# Patient Record
Sex: Male | Born: 1965 | Race: Black or African American | Hispanic: No | Marital: Single | Smoking: Current every day smoker
Health system: Southern US, Community
[De-identification: ages and names within clinical notes are randomized; demographics above are authoritative.]

## PROBLEM LIST (undated history)

## (undated) DIAGNOSIS — F1410 Cocaine abuse, uncomplicated: Secondary | ICD-10-CM

## (undated) DIAGNOSIS — F1010 Alcohol abuse, uncomplicated: Secondary | ICD-10-CM

## (undated) DIAGNOSIS — M549 Dorsalgia, unspecified: Secondary | ICD-10-CM

---

## 2000-12-09 ENCOUNTER — Emergency Department (HOSPITAL_COMMUNITY): Admission: EM | Admit: 2000-12-09 | Discharge: 2000-12-09 | Payer: Self-pay | Admitting: Emergency Medicine

## 2013-01-29 ENCOUNTER — Emergency Department: Payer: Self-pay | Admitting: Emergency Medicine

## 2013-01-29 LAB — URINALYSIS, COMPLETE
Bilirubin,UR: NEGATIVE
Glucose,UR: NEGATIVE mg/dL (ref 0–75)
Leukocyte Esterase: NEGATIVE
Ph: 5 (ref 4.5–8.0)
Protein: NEGATIVE
RBC,UR: 3 /HPF (ref 0–5)
Specific Gravity: 1.031 (ref 1.003–1.030)
Squamous Epithelial: NONE SEEN
WBC UR: 3 /HPF (ref 0–5)

## 2013-06-06 ENCOUNTER — Emergency Department (HOSPITAL_COMMUNITY)
Admission: EM | Admit: 2013-06-06 | Discharge: 2013-06-06 | Disposition: A | Discharge status: Home / Self Care | Payer: Self-pay | Attending: Emergency Medicine | Admitting: Emergency Medicine

## 2013-06-06 ENCOUNTER — Encounter (HOSPITAL_COMMUNITY): Payer: Self-pay | Admitting: Emergency Medicine

## 2013-06-06 DIAGNOSIS — F1410 Cocaine abuse, uncomplicated: Secondary | ICD-10-CM | POA: Insufficient documentation

## 2013-06-06 DIAGNOSIS — F17200 Nicotine dependence, unspecified, uncomplicated: Secondary | ICD-10-CM | POA: Insufficient documentation

## 2013-06-06 DIAGNOSIS — F1010 Alcohol abuse, uncomplicated: Secondary | ICD-10-CM | POA: Insufficient documentation

## 2013-06-06 HISTORY — DX: Cocaine abuse, uncomplicated: F14.10

## 2013-06-06 HISTORY — DX: Alcohol abuse, uncomplicated: F10.10

## 2013-06-06 LAB — COMPREHENSIVE METABOLIC PANEL
ALT: 29 U/L (ref 0–53)
Alkaline Phosphatase: 52 U/L (ref 39–117)
CO2: 26 mEq/L (ref 19–32)
GFR calc Af Amer: >90 mL/min (ref >90)
GFR calc non Af Amer: >90 mL/min (ref >90)
Glucose, Bld: 106 mg/dL — ABNORMAL HIGH (ref 70–99)
Potassium: 3.4 mEq/L — ABNORMAL LOW (ref 3.5–5.1)
Sodium: 137 mEq/L (ref 135–145)

## 2013-06-06 LAB — CBC WITH DIFFERENTIAL/PLATELET
Eosinophils Relative: 2 % (ref 0–5)
Hemoglobin: 15 g/dL (ref 13.0–17.0)
Lymphocytes Relative: 23 % (ref 12–46)
Lymphs Abs: 2.4 10*3/uL (ref 0.7–4.0)
MCV: 95.5 fL (ref 78.0–100.0)
Monocytes Relative: 9 % (ref 3–12)
Neutrophils Relative %: 66 % (ref 43–77)
Platelets: 256 10*3/uL (ref 150–400)
RBC: 4.48 MIL/uL (ref 4.22–5.81)
WBC: 10.4 10*3/uL (ref 4.0–10.5)

## 2013-06-06 NOTE — Discharge Instructions (Signed)
Please followup with one of the treatment programs given to you on the resource sheet.  Return to the emergency department for worsening condition or new concerning symptoms.   Alcohol Problems Most adults who drink alcohol drink in moderation (not a lot) are at low risk for developing problems related to their drinking. However, all drinkers, including low-risk drinkers, should know about the health risks connected with drinking alcohol. RECOMMENDATIONS FOR LOW-RISK DRINKING  Drink in moderation. Moderate drinking is defined as follows:   Men - no more than 2 drinks per day.  Nonpregnant women - no more than 1 drink per day.  Over age 5 - no more than 1 drink per day. A standard drink is 12 grams of pure alcohol, which is equal to a 12 ounce bottle of beer or wine cooler, a 5 ounce glass of wine, or 1.5 ounces of distilled spirits (such as whiskey, brandy, vodka, or rum).  ABSTAIN FROM (DO NOT DRINK) ALCOHOL:  When pregnant or considering pregnancy.  When taking a medication that interacts with alcohol.  If you are alcohol dependent.  A medical condition that prohibits drinking alcohol (such as ulcer, liver disease, or heart disease). DISCUSS WITH YOUR CAREGIVER:  If you are at risk for coronary heart disease, discuss the potential benefits and risks of alcohol use: Light to moderate drinking is associated with lower rates of coronary heart disease in certain populations (for example, men over age 67 and postmenopausal women). Infrequent or nondrinkers are advised not to begin light to moderate drinking to reduce the risk of coronary heart disease so as to avoid creating an alcohol-related problem. Similar protective effects can likely be gained through proper diet and exercise.  Women and the elderly have smaller amounts of body water than men. As a result women and the elderly achieve a higher blood alcohol concentration after drinking the same amount of alcohol.  Exposing a fetus to  alcohol can cause a broad range of birth defects referred to as Fetal Alcohol Syndrome (FAS) or Alcohol-Related Birth Defects (ARBD). Although FAS/ARBD is connected with excessive alcohol consumption during pregnancy, studies also have reported neurobehavioral problems in infants born to mothers reporting drinking an average of 1 drink per day during pregnancy.  Heavier drinking (the consumption of more than 4 drinks per occasion by men and more than 3 drinks per occasion by women) impairs learning (cognitive) and psychomotor functions and increases the risk of alcohol-related problems, including accidents and injuries. CAGE QUESTIONS:   Have you ever felt that you should Cut down on your drinking?  Have people Annoyed you by criticizing your drinking?  Have you ever felt bad or Guilty about your drinking?  Have you ever had a drink first thing in the morning to steady your nerves or get rid of a hangover (Eye opener)? If you answered positively to any of these questions: You may be at risk for alcohol-related problems if alcohol consumption is:   Men: Greater than 14 drinks per week or more than 4 drinks per occasion.  Women: Greater than 7 drinks per week or more than 3 drinks per occasion. Do you or your family have a medical history of alcohol-related problems, such as:  Blackouts.  Sexual dysfunction.  Depression.  Trauma.  Liver dysfunction.  Sleep disorders.  Hypertension.  Chronic abdominal pain.  Has your drinking ever caused you problems, such as problems with your family, problems with your work (or school) performance, or accidents/injuries?  Do you have a compulsion to  drink or a preoccupation with drinking?  Do you have poor control or are you unable to stop drinking once you have started?  Do you have to drink to avoid withdrawal symptoms?  Do you have problems with withdrawal such as tremors, nausea, sweats, or mood disturbances?  Does it take more alcohol  than in the past to get you high?  Do you feel a strong urge to drink?  Do you change your plans so that you can have a drink?  Do you ever drink in the morning to relieve the shakes or a hangover? If you have answered a number of the previous questions positively, it may be time for you to talk to your caregivers, family, and friends and see if they think you have a problem. Alcoholism is a chemical dependency that keeps getting worse and will eventually destroy your health and relationships. Many alcoholics end up dead, impoverished, or in prison. This is often the end result of all chemical dependency.  Do not be discouraged if you are not ready to take action immediately.  Decisions to change behavior often involve up and down desires to change and feeling like you cannot decide.  Try to think more seriously about your drinking behavior.  Think of the reasons to quit. WHERE TO GO FOR ADDITIONAL INFORMATION   The National Institute on Alcohol Abuse and Alcoholism (NIAAA) BasicStudents.dk  [employer] on Alcoholism and Drug Dependence (NCADD) www.ncadd.org  American Society of Addiction Medicine (ASAM) RoyalDiary.gl  Document Released: 11/14/2005 Document Revised: 02/06/2012 Document Reviewed: 07/02/2008 [facility] Patient Information 2014 [place], [place].  Cocaine Abuse and Chemical Dependency WHEN IS DRUG USE A PROBLEM? Anytime drug use is interfering with normal living activities it has become abuse. This includes problems with family and friends. Psychological dependence has developed when your mind tells you that the drug is needed. This is usually followed by physical dependence which has developed when continuing increases of drug are required to get the same feeling or "high". This is known as addiction or chemical dependency. A person's risk is much higher if there is a history of chemical dependency in the family. SIGNS OF CHEMICAL DEPENDENCY:  Been told by friends or  family that drugs have become a problem.  Fighting when using drugs.  Having blackouts (not remembering what you do while using).  Feel sick from using drugs but continue using.  Lie about use or amounts of drugs (chemicals) used.  Need chemicals to get you going.  Suffer in work International aid/development worker or school because of drug use.  Get sick from use of drugs but continue to use anyway.  Need drugs to relate to people or feel comfortable in social situations.  Use drugs to forget problems. Yes answered to any of the above signs of chemical dependency indicates there are problems. The longer the use of drugs continues, the greater the problems will become. If there is a family history of drug or alcohol use it is best not to experiment with these drugs. Experimentation leads to tolerance and needing to use more of the drug to get the same feeling. This is followed by addiction where drugs become the most important part of life. It becomes more important to take drugs than participate in the other usual activities of life including relating to friends and family. Addiction is followed by dependency where drugs are now needed not just to get high but to feel normal. Addiction cannot be cured but it can be stopped. This often requires  outside help and the care of professionals. Treatment centers are listed in the yellow pages under: Cocaine, Narcotics, and Alcoholics Anonymous. Most hospitals and clinics can refer you to a specialized care center. WHAT IS COCAINE? Cocaine is a strong nervous system stimulant which speeds up the body and gives the user the feeling that they have increased energy, loss of appetite and feelings of great pleasure. This "high" which begins within several minutes and lasts for less than an hour is followed by a "crash". The crash and depressed feelings that come with it cause a craving for the drug to regain the high. HOW IS COCANINE USED? Cocaine is snorted, injected, and  smoked as free- base or crack. Because smoking the drug produces a greater high it is also associated with a greater low. It is therefore more rapidly addicting. WHAT ARE THE EFFECTS OF COCAINE? It is an anesthetic (pain killer) and a stimulant (it causes a high which gives a false feeling of well being). It increases heart and breathing rates with increases in body temperature and blood pressure. It removes appetite. It causes seizures (convulsions) along with nausea (feeling sick to your stomach), vomiting and stomach pain. This dangerous combination can lead to death. Trying to keep the high feeling leads to greater and greater drug use and this leads to addiction. Addiction can only be helped by stopping use of all chemicals. This is hard but may save your life. If the addiction is continued, the only possible outcome is loss of self respect and self esteem, violence, death, and eventually prison if the addict is fortunate enough to be caught and able to receive help prior to this last life ending event. OTHER HEALTH RISKS OF COCAINE AND ALL DRUG USE ARE:  The increased possibility of getting AIDS or hepatitis (liver inflammation).  Having a baby born which is addicted to cocaine and must go through painful withdrawal including shaking, jerking, and crying in pain. Many of the babies die. Other babies go through life with lifelong disabilities and learning problems. HOW TO STAY DRUG FREE ONCE YOU HAVE QUIT USING:  Develop healthy activities and form friends who do not use drugs.  Stay away from the drug scene.  Tell the pusher or former friend you have other better things to do.  Have ready excuses available about why you cannot use. For more help or information contact your local physician, clinic, hospital or dial 1-800-cocaine 352-510-8706). Document Released: 11/11/2000 Document Revised: 02/06/2012 Document Reviewed: 07/02/2008 [facility] Patient Information 2014 [place], [place].  Drug  Abuse, Frequently Asked Questions Drug addiction is a complex brain disease. It is characterized by compulsive, at times uncontrollable, drug craving, seeking, and use that persists even in the face of extremely negative results. Drug seeking becomes compulsive, in large part as a result of the effects of prolonged drug use on brain functioning and, thus, on behavior. For many people, drug addiction becomes chronic, with relapses possible even after long periods of being off the drug. HOW QUICKLY CAN I BECOME ADDICTED TO A DRUG? There is no easy answer to this. If and how quickly you might become addicted to a drug depends on many factors including the biology of your body. All drugs are potentially harmful and may have life-threatening consequences associated with their use. There are also vast differences among individuals in sensitivity to various drugs. While one person may use a drug many times and suffer no ill effects, another person may be particularly vulnerable and overdose or developing  a craving with the first use. There is no way of knowing in advance how someone may react. HOW DO I KNOW IF SOMEONE IS ADDICTED TO DRUGS? If a person is compulsively seeking and using a drug despite negative consequences (such as loss of job, debt, physical problems brought on by drug abuse, or family problems) then he or she is probably addicted. Those who screen for drug problems, such as physicians, have developed the CAGE questionnaire. These four simple questions can help detect substance abuse problems:  Have you ever felt you ought to Cut down on your drinking/drug use?  Have people ever Annoyed you by criticizing your drinking/drug use?  Have you ever felt bad or Guilty about your drinking/drug use?  Have you ever had a drink or taken a drug first thing in the morning to steady your nerves or get rid of a hangover (Eye-opener)? WHAT ARE THE PHYSICAL SIGNS OF ABUSE OR ADDICTION? The physical signs of  abuse or addiction can vary depending on the person and the drug being abused. For example, someone who abuses marijuana may have a chronic cough or worsening of asthmatic conditions. THC, the chemical in marijuana responsible for producing its effects, is associated with weakening the immune system which makes the user more vulnerable to infections, such as pneumonia. Each drug has short-term and long-term physical effects. Stimulants like cocaine increase heart rate and blood pressure, whereas opioids like heroin may slow the heart rate and reduce breathing (respiration).  ARE THERE EFFECTIVE TREATMENTS FOR DRUG ADDICTION? Drug addiction can be effectively treated with behavioral-based therapies and, for addiction to some drugs such as heroin or nicotine, medications may be used. Treatment may vary for each person depending on the type of drug(s) being used and multiple courses of treatment may be needed to achieve success. Research has revealed 13 basic principles that underlie effective drug addiction treatment. These are discussed in NIDA's Principles of Drug Addiction Treatment: A Research-Based Guide. WHERE CAN I FIND INFORMATION ABOUT DRUG TREATMENT PROGRAMS?  For referrals to treatment programs, visit the Substance Abuse and Mental Health Services Administration online at http://findtreatment.http://gonzalez-rivas.net/.  NIDA publishes an expanding series of treatment manuals, the "clinical toolbox," that gives drug treatment providers research-based information for creating effective treatment programs. WHAT IS DETOXIFICATION, OR "DETOX"? Detoxification is the process of allowing the body to rid itself of a drug while managing the symptoms of withdrawal. It is often the first step in a drug treatment program and should be followed by treatment with a behavioral-based therapy and/or a medication, if available. Detox alone with no follow-up is not treatment.  WHAT IS WITHDRAWAL? HOW LONG DOES IT LAST? Withdrawal  is the variety of symptoms that occur after use of some addictive drugs is reduced or stopped. Length of withdrawal and symptoms vary with the type of drug. For example, physical symptoms of heroin withdrawal may include restlessness, muscle and bone pain, insomnia, diarrhea, vomiting, and cold flashes. These physical symptoms may last for several days, but the general depression, or dysphoria (opposite of euphoria), that often accompanies heroin withdrawal, may last for weeks. In many cases withdrawal can be easily treated with medications to ease the symptoms. But treating withdrawal is not the same as treating addiction.  WHAT ARE THE COSTS OF DRUG ABUSE TO SOCIETY? Beyond the raw numbers are other costs to society:  Spread of infectious diseases such as HIV/AIDS and hepatitis C either through sharing of drug paraphernalia or unprotected sex.  Deaths due to overdose or  other complications from drug use.  Effects on unborn children of pregnant drug users.  Other effects such as crime and homelessness. IF A PREGNANT WOMAN ABUSES DRUGS, DOES IT AFFECT THE FETUS?  Many substances including alcohol, nicotine, and drugs of abuse can have negative effects on the developing fetus because they are transferred to the fetus across the placenta. For example, nicotine has been connected with premature birth and low birth weight, as has the use of cocaine. Scientific studies have shown that babies born to marijuana users were shorter, weighed less, and had smaller head sizes than those born to mothers who did not use the drug. Smaller babies are more likely to develop health problems.  Whether a baby's health problems, if caused by a drug, will continue as the child grows, is not always known. Research does show that children born to mothers who used marijuana regularly during pregnancy may have trouble concentrating, when older. Our research continues to produce insights on the negative effects of drug use on  the fetus. Document Released: 11/17/2003 Document Revised: 02/06/2012 Document Reviewed: 02/13/2009 [facility] Patient Information 2014 [place], [place].

## 2013-06-06 NOTE — ED Notes (Signed)
PT. REQUESTING DETOX FOR COCAINE AND ETOH ABUSE , LAST COCAINE AND ETOH TODAY , DENIES SUICIDAL IDEATION , NO HALLUCINATIONS , CALM AND COOPERATIVE AT TRIAGE.

## 2013-06-06 NOTE — ED Notes (Signed)
MD Otter at bedside. 

## 2013-06-06 NOTE — ED Notes (Signed)
Pt reports crack cocaine abuse x 11 years but states "I have been doing it a lot more than normal.I've done it three or four times in the last two months" Pt states that "I have seen this ruin so many lives and I don't want that to happen to me. I lost my job, my girl is mad at me, and I want to get help before this is out of control." Pt denies ETOH abuse. States he drinks 4-5 beers/day, but states "I drink a beer or two everyday but I don't get shaky or crave it." Pt calm and cooperative.

## 2013-06-07 NOTE — ED Provider Notes (Signed)
   History    CSN: [id number] Arrival date & time 06/06/13  0012  First MD Initiated Contact with Patient 06/06/13 0203     Chief Complaint  Patient presents with  . Drug Problem   (Consider location/radiation/quality/duration/timing/severity/associated sxs/prior Treatment) HPI 47 yo male presents to the ER requesting detox for crack cocaine.  Over the last 2 months, pt has used crack about 4 times, most recently yesterday.  Pt also reports 4-5 beers/day but does not consider this a problem has no withdrawal sxs if he goes without drinking.  No prior substance abuse counseling.  No other mental health issues.  Past Medical History  Diagnosis Date  . Alcohol abuse   . Cocaine abuse    History reviewed. No pertinent past surgical history. No family history on file. History  Substance Use Topics  . Smoking status: Current Every Day Smoker  . Smokeless tobacco: Not on file  . Alcohol Use: Yes    Review of Systems  All other systems reviewed and are negative.    Allergies  Review of patient's allergies indicates no known allergies.  Home Medications  No current outpatient prescriptions on file. BP 122/76  Pulse 76  Temp(Src) 98.8 F (37.1 C) (Oral)  Resp 14  SpO2 99% Physical Exam  Nursing note and vitals reviewed. Constitutional: He is oriented to person, place, and time. He appears well-developed and well-nourished.  HENT:  Head: Normocephalic and atraumatic.  Nose: Nose normal.  Mouth/Throat: Oropharynx is clear and moist.  Eyes: Conjunctivae and EOM are normal. Pupils are equal, round, and reactive to light.  Neck: Normal range of motion. Neck supple. No JVD present. No tracheal deviation present. No thyromegaly present.  Cardiovascular: Normal rate, regular rhythm, normal heart sounds and intact distal pulses.  Exam reveals no gallop and no friction rub.   No murmur heard. Pulmonary/Chest: Effort normal and breath sounds normal. No stridor. No respiratory  distress. He has no wheezes. He has no rales. He exhibits no tenderness.  Abdominal: Soft. Bowel sounds are normal. He exhibits no distension and no mass. There is no tenderness. There is no rebound and no guarding.  Musculoskeletal: Normal range of motion. He exhibits no edema and no tenderness.  Lymphadenopathy:    He has no cervical adenopathy.  Neurological: He is alert and oriented to person, place, and time. He has normal reflexes. He exhibits normal muscle tone. Coordination normal.  Skin: Skin is dry. No rash noted. No erythema. No pallor.  Psychiatric: He has a normal mood and affect. His behavior is normal. Thought content normal.  Poor insight and judgement    ED Course  Procedures (including critical care time) Labs Reviewed  COMPREHENSIVE METABOLIC PANEL - Abnormal; Notable for the following:    Potassium 3.4 (*)    Glucose, Bld 106 (*)    AST 40 (*)    All other components within normal limits  ETHANOL  CBC WITH DIFFERENTIAL   No results found. 1. Cocaine abuse   2. ETOH abuse     MDM  47 yo male with crack cocaine abuse, alcohol overuse.  Pt given outpatient substance abuse resources.  Olivia Mackie, MD 06/07/13 [phone]

## 2014-01-27 ENCOUNTER — Emergency Department (HOSPITAL_COMMUNITY): Payer: No Typology Code available for payment source

## 2014-01-27 ENCOUNTER — Encounter (HOSPITAL_COMMUNITY): Payer: Self-pay | Admitting: Emergency Medicine

## 2014-01-27 ENCOUNTER — Emergency Department (HOSPITAL_COMMUNITY)
Admission: EM | Admit: 2014-01-27 | Discharge: 2014-01-27 | Disposition: A | Discharge status: Home / Self Care | Payer: No Typology Code available for payment source | Attending: Emergency Medicine | Admitting: Emergency Medicine

## 2014-01-27 DIAGNOSIS — S0990XA Unspecified injury of head, initial encounter: Secondary | ICD-10-CM | POA: Insufficient documentation

## 2014-01-27 DIAGNOSIS — Y9389 Activity, other specified: Secondary | ICD-10-CM | POA: Insufficient documentation

## 2014-01-27 DIAGNOSIS — R413 Other amnesia: Secondary | ICD-10-CM | POA: Insufficient documentation

## 2014-01-27 DIAGNOSIS — M898X1 Other specified disorders of bone, shoulder: Secondary | ICD-10-CM

## 2014-01-27 DIAGNOSIS — F17200 Nicotine dependence, unspecified, uncomplicated: Secondary | ICD-10-CM | POA: Insufficient documentation

## 2014-01-27 DIAGNOSIS — G8929 Other chronic pain: Secondary | ICD-10-CM | POA: Insufficient documentation

## 2014-01-27 DIAGNOSIS — S1093XA Contusion of unspecified part of neck, initial encounter: Principal | ICD-10-CM

## 2014-01-27 DIAGNOSIS — S46909A Unspecified injury of unspecified muscle, fascia and tendon at shoulder and upper arm level, unspecified arm, initial encounter: Secondary | ICD-10-CM | POA: Insufficient documentation

## 2014-01-27 DIAGNOSIS — Y92410 Unspecified street and highway as the place of occurrence of the external cause: Secondary | ICD-10-CM | POA: Insufficient documentation

## 2014-01-27 DIAGNOSIS — S0083XA Contusion of other part of head, initial encounter: Principal | ICD-10-CM

## 2014-01-27 DIAGNOSIS — S4980XA Other specified injuries of shoulder and upper arm, unspecified arm, initial encounter: Secondary | ICD-10-CM | POA: Insufficient documentation

## 2014-01-27 DIAGNOSIS — S0003XA Contusion of scalp, initial encounter: Secondary | ICD-10-CM | POA: Insufficient documentation

## 2014-01-27 HISTORY — DX: Dorsalgia, unspecified: M54.9

## 2014-01-27 MED ORDER — CYCLOBENZAPRINE HCL 10 MG PO TABS: 10.0000 mg | ORAL_TABLET | Freq: Three times a day (TID) | ORAL | Status: DC | PRN

## 2014-01-27 MED ORDER — HYDROCODONE-ACETAMINOPHEN 5-325 MG PO TABS: 1.0000 | ORAL_TABLET | ORAL | Status: DC | PRN

## 2014-01-27 MED ORDER — HYDROCODONE-ACETAMINOPHEN 5-325 MG PO TABS
1.0000 | ORAL_TABLET | Freq: Once | ORAL | Status: AC
  Administered 2014-01-27: 1 via ORAL
  Filled 2014-01-27: qty 1
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
## 2014-03-12 ENCOUNTER — Emergency Department (HOSPITAL_COMMUNITY)
Admission: EM | Admit: 2014-03-12 | Discharge: 2014-03-12 | Disposition: A | Discharge status: Home / Self Care | Payer: Self-pay | Attending: Emergency Medicine | Admitting: Emergency Medicine

## 2014-03-12 ENCOUNTER — Encounter (HOSPITAL_COMMUNITY): Payer: Self-pay | Admitting: Emergency Medicine

## 2014-03-12 DIAGNOSIS — F14921 Cocaine use, unspecified with intoxication delirium: Secondary | ICD-10-CM

## 2014-03-12 DIAGNOSIS — S1190XA Unspecified open wound of unspecified part of neck, initial encounter: Secondary | ICD-10-CM | POA: Insufficient documentation

## 2014-03-12 DIAGNOSIS — Y929 Unspecified place or not applicable: Secondary | ICD-10-CM | POA: Insufficient documentation

## 2014-03-12 DIAGNOSIS — F1210 Cannabis abuse, uncomplicated: Secondary | ICD-10-CM | POA: Insufficient documentation

## 2014-03-12 DIAGNOSIS — IMO0002 Reserved for concepts with insufficient information to code with codable children: Secondary | ICD-10-CM | POA: Insufficient documentation

## 2014-03-12 DIAGNOSIS — Z23 Encounter for immunization: Secondary | ICD-10-CM | POA: Insufficient documentation

## 2014-03-12 DIAGNOSIS — W868XXA Exposure to other electric current, initial encounter: Secondary | ICD-10-CM | POA: Insufficient documentation

## 2014-03-12 DIAGNOSIS — T148XXA Other injury of unspecified body region, initial encounter: Secondary | ICD-10-CM

## 2014-03-12 DIAGNOSIS — S41109A Unspecified open wound of unspecified upper arm, initial encounter: Secondary | ICD-10-CM | POA: Insufficient documentation

## 2014-03-12 DIAGNOSIS — Z8739 Personal history of other diseases of the musculoskeletal system and connective tissue: Secondary | ICD-10-CM | POA: Insufficient documentation

## 2014-03-12 DIAGNOSIS — T754XXA Electrocution, initial encounter: Secondary | ICD-10-CM

## 2014-03-12 DIAGNOSIS — F19921 Other psychoactive substance use, unspecified with intoxication with delirium: Secondary | ICD-10-CM | POA: Insufficient documentation

## 2014-03-12 DIAGNOSIS — Y9389 Activity, other specified: Secondary | ICD-10-CM | POA: Insufficient documentation

## 2014-03-12 DIAGNOSIS — F1410 Cocaine abuse, uncomplicated: Secondary | ICD-10-CM | POA: Insufficient documentation

## 2014-03-12 DIAGNOSIS — F17200 Nicotine dependence, unspecified, uncomplicated: Secondary | ICD-10-CM | POA: Insufficient documentation

## 2014-03-12 DIAGNOSIS — Z8659 Personal history of other mental and behavioral disorders: Secondary | ICD-10-CM | POA: Insufficient documentation

## 2014-03-12 LAB — COMPREHENSIVE METABOLIC PANEL
ALBUMIN: 3.8 g/dL (ref 3.5–5.2)
ALT: 35 U/L (ref 0–53)
AST: 30 U/L (ref 0–37)
Alkaline Phosphatase: 50 U/L (ref 39–117)
BILIRUBIN TOTAL: 0.4 mg/dL (ref 0.3–1.2)
BUN: 17 mg/dL (ref 6–23)
CALCIUM: 9.8 mg/dL (ref 8.4–10.5)
CO2: 19 mEq/L (ref 19–32)
CREATININE: 1.06 mg/dL (ref 0.50–1.35)
Chloride: 105 mEq/L (ref 96–112)
GFR calc Af Amer: >90 mL/min (ref >90)
GFR calc non Af Amer: 82 mL/min — ABNORMAL LOW (ref >90)
Glucose, Bld: 93 mg/dL (ref 70–99)
Potassium: 4.4 mEq/L (ref 3.7–5.3)
Sodium: 142 mEq/L (ref 137–147)
TOTAL PROTEIN: 7.5 g/dL (ref 6.0–8.3)

## 2014-03-12 LAB — CBC WITH DIFFERENTIAL/PLATELET
BASOS PCT: 0 % (ref 0–1)
Basophils Absolute: 0 10*3/uL (ref 0.0–0.1)
EOS ABS: 0 10*3/uL (ref 0.0–0.7)
Eosinophils Relative: 0 % (ref 0–5)
HEMATOCRIT: 44.6 % (ref 39.0–52.0)
Hemoglobin: 15.6 g/dL (ref 13.0–17.0)
Lymphocytes Relative: 10 % — ABNORMAL LOW (ref 12–46)
Lymphs Abs: 1.1 10*3/uL (ref 0.7–4.0)
MCH: 33.3 pg (ref 26.0–34.0)
MCHC: 35 g/dL (ref 30.0–36.0)
MCV: 95.3 fL (ref 78.0–100.0)
MONO ABS: 0.7 10*3/uL (ref 0.1–1.0)
Monocytes Relative: 6 % (ref 3–12)
NEUTROS ABS: 9.5 10*3/uL — AB (ref 1.7–7.7)
Neutrophils Relative %: 84 % — ABNORMAL HIGH (ref 43–77)
Platelets: 279 10*3/uL (ref 150–400)
RBC: 4.68 MIL/uL (ref 4.22–5.81)
RDW: 12.6 % (ref 11.5–15.5)
WBC: 11.4 10*3/uL — ABNORMAL HIGH (ref 4.0–10.5)

## 2014-03-12 LAB — RAPID URINE DRUG SCREEN, HOSP PERFORMED
AMPHETAMINES: NOT DETECTED
BENZODIAZEPINES: NOT DETECTED
Barbiturates: NOT DETECTED
COCAINE: POSITIVE — AB
OPIATES: NOT DETECTED
TETRAHYDROCANNABINOL: POSITIVE — AB

## 2014-03-12 LAB — ETHANOL: Alcohol, Ethyl (B): 98 mg/dL — ABNORMAL HIGH (ref 0–11)

## 2014-03-12 LAB — TROPONIN I

## 2014-03-12 LAB — SALICYLATE LEVEL: Salicylate Lvl: <2 mg/dL — ABNORMAL LOW (ref 2.8–20.0)

## 2014-03-12 LAB — ACETAMINOPHEN LEVEL: Acetaminophen (Tylenol), Serum: <15 ug/mL (ref 10–30)

## 2014-03-12 MED ORDER — TETANUS-DIPHTH-ACELL PERTUSSIS 5-2.5-18.5 LF-MCG/0.5 IM SUSP
0.5000 mL | Freq: Once | INTRAMUSCULAR | Status: AC
  Administered 2014-03-12: 0.5 mL via INTRAMUSCULAR
  Filled 2014-03-12: qty 0.5

## 2014-03-12 MED ORDER — LORAZEPAM 2 MG/ML IJ SOLN
1.0000 mg | Freq: Once | INTRAMUSCULAR | Status: DC
  Filled 2014-03-12: qty 1

## 2014-03-12 MED ORDER — LORAZEPAM 2 MG/ML IJ SOLN
1.0000 mg | Freq: Once | INTRAMUSCULAR | Status: AC
  Administered 2014-03-12: 1 mg via INTRAVENOUS

## 2014-03-12 MED ORDER — STERILE WATER FOR INJECTION IJ SOLN
INTRAMUSCULAR | Status: AC
  Administered 2014-03-12: 1.2 mL
  Filled 2014-03-12: qty 10

## 2014-03-12 MED ORDER — SODIUM CHLORIDE 0.9 % IV SOLN
Freq: Once | INTRAVENOUS | Status: AC
  Administered 2014-03-12: 1000 mL/h via INTRAVENOUS

## 2014-03-12 MED ORDER — ZIPRASIDONE MESYLATE 20 MG IM SOLR
20.0000 mg | Freq: Once | INTRAMUSCULAR | Status: AC
  Administered 2014-03-12: 20 mg via INTRAMUSCULAR

## 2014-03-12 NOTE — ED Notes (Signed)
[facility], recommended HR under 100 bpm.

## 2014-03-12 NOTE — ED Notes (Signed)
States "Call my momma".

## 2014-03-12 NOTE — ED Notes (Signed)
Patient increase using loud voice and yelling "Where is [name]" Asked to use bathroom assisted patient with urinal multiple times does not urinate instead states "Amanda" loud or yelling. [place] police at bedside.

## 2014-03-12 NOTE — ED Notes (Signed)
[facility] [place] and in police custody.

## 2014-03-12 NOTE — ED Notes (Signed)
Pt removed cardiac leads, blood pressure cuff, and pulse ox.

## 2014-03-12 NOTE — ED Notes (Signed)
Pt repeatedly asking for "[name]" and "Mama." Pt also screaming out stating "I don't have anything"
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
## 2014-03-12 NOTE — ED Notes (Signed)
Asked EDP for Narcan not ordered at this time.

## 2014-03-12 NOTE — ED Notes (Signed)
2 patient belonging bags given to [employer] (clothes and shoes)

## 2014-03-12 NOTE — ED Notes (Signed)
Pt very guarded and trying to get off the bed, unable to get vitals at this time.

## 2014-03-12 NOTE — ED Notes (Signed)
Pt not talking.

## 2014-03-12 NOTE — ED Notes (Signed)
Nurse at bedside since Geodon administration. Patient asked repeatedly "Where is [name]?" "Sweetpea" "I need to use the bathroom" Assisted patient with urinal multiple times. Patient urinated in urinal 350ml.

## 2014-03-12 NOTE — ED Notes (Signed)
Patient states [name] is his girlfriend and complete number is [phone] Call her I need to talk with her.

## 2014-03-12 NOTE — ED Notes (Signed)
Attempted bolus of NS on patient per Dr.Delo; pt pulled IV line toward mouth and bit tubing in half. Dr. Judd Lienelo aware of situation; second dose of 1 mg IV ordered

## 2014-03-12 NOTE — ED Notes (Signed)
Contacted poison control about crack/cocaine ingestion. Poison control states to give benzodiazepines and IV fluids for agitation and rehydration; EKG and blood work completed. Poison Control will contact this RN for follow up

## 2014-03-12 NOTE — Discharge Instructions (Signed)
Poisoning Information, Adult  Poisoning is illness caused by eating, drinking, touching, or inhaling a harmful substance. The damaging effects on the person's health will vary depending on the type of poison, the amount of exposure, and the duration of exposure before treatment. These effects may range from mild to very severe or even fatal.   Most poisonings take place in the home and involve common household products. They can also occur in the workplace, especially in industrial or manufacturing facilities. Poisoning is more common in children than adults. However, poisoning often causes more serious illness in adults. Poisonings are often accidental, but there are also many cases in which a person intentionally ingests poison.  WHAT THINGS MAY BE POISONOUS?   A poison can be any substance that causes illness or harm to the body. Poisoning is often caused by products that are commonly found in homes. Many substances can become poisonous if used in ways or amounts that are not appropriate. Some common products that can cause poisoning are:   · Medicines, including prescription medicines, over-the-counter pain medicines, vitamins, iron pills, and herbal supplements.  · Cleaning or laundry products.  · Paint and paint thinner.  · Weed or insect killers.  · Perfume, hair spray, or nail products.  · Alcohol.  · Plants, such as philodendron, poinsettia, oleander, castor bean, cactus, and tomato plants.  · Batteries.  · Furniture polish.  · Drain cleaners.  · Antifreeze or other automotive products.  · Gasoline, lighter fluid, or lamp oil.  · Carbon monoxide gas from furnaces or automobiles.  · Toxic fumes from the burning of plastics or certain other materials.  WHAT ARE SOME FIRST-AID MEASURES FOR POISONING?  The local poison control center must be contacted whenever a person may have been exposed to poison. The poison control specialist will often give a set of directions to follow over the phone. These directions  may include the following:  · Remove any substance that is still in the mouth if the poison was not food or medicine. Drink a small amount of water.  · Keep the medicine container if too much medicine or the wrong medicine was swallowed. Use it to identify the medicine to the poison control specialist.   · Get away from the area where exposure occurred as soon as possible if the poison was from fumes or chemicals.  · Get fresh air as soon as possible if a poison was inhaled.  · Remove any affected clothing and rinse the skin with water if a poison got on the skin.   · Rinse the eyes with water if a poison or chemical got in the eyes.  · Begin cardiopulmonary resuscitation (CPR) if breathing stops.  HOW CAN YOU PREVENT POISONING?  Take these steps to help prevent poisoning:  · Keep medicines and chemical products in their original containers. Many of these come in child-safe packaging. Store them in areas out of reach of children.  · Educate others about the dangers of possible poisons.  · Read labels before using medicine or household products. Leave the original labels on the containers.  · Always turn on a light when taking medicine. Check the dosage every time.    · Close the containers tightly after using medicine or chemical products.  · Get rid of unneeded and outdated medicines by following the specific disposal instructions on the medicine label or the patient information that came with the medicine. Do not put medicine in the trash or flush it down the toilet. Use the community's   drug take-back program to dispose of medicine. If these options are not available, take the medicine out of the original container and mix it with an undesirable substance, such as coffee grounds or kitty litter. Seal the mixture in a sealable bag, can, or other container and throw it away.   · Keep all dangerous household products (such as lighter fluid, paint thinner and remover, gasoline, and antifreeze) in locked  cabinets.  · Do not mix different household chemicals with each other.  · Use protective equipment (gloves, goggles, masks, aprons) as needed when using chemicals or cleaners.  · Install a carbon monoxide detector in your home.  WHEN SHOULD YOU SEEK HELP?   Contact the poison control center whenever you suspect that a person has been exposed to poison. Call 1-800-222-1222 (in the [place] to reach a poison center for your area. If you are outside the [place] ask your caregiver what the phone number is for your local poison control center. Keep the phone number posted near your phone. Make sure everyone in your household knows where to find the number.  The local emergency services (911 in [place] must be contacted if a person has been exposed to poison and:   · Has trouble breathing or stops breathing.  · Develops chest pain.  · Has trouble staying awake or becomes unconscious.  · Has a seizure.  · Has severe vomiting or bleeding.  · Has a worsening headache.  · Has a decreased level of alertness.  · Develops a widespread rash that may or may not be painful.  · Has changes in vision.  · Has difficulty swallowing.  · Develops severe abdominal pain.  FOR MORE INFORMATION   American Association of Poison Control Centers: www.aapcc.org  Document Released: 10/31/2012 Document Reviewed: 10/31/2012  ExitCare® Patient Information ©2014 [employer].

## 2014-03-12 NOTE — ED Provider Notes (Signed)
CSN: [id number]     Arrival date & time 03/12/14  1719 History   None    This chart was scribed for non-physician practitioner, Arthor CaptainAbigail Lattie Cervi, PA-C, working with Gwyneth SproutWhitney Plunkett, MD by Arlan OrganAshley Leger, ED Scribe. This patient was seen in room Triage Room 2 and the patient's care was started at 5:43 PM.   Chief Complaint  Patient presents with  . Puncture Wound   HPI  HPI Comments: Leonard Bullock is a 48 y.o. male who presents to the Emergency Department complaining of multiple puncture wound sustained just prior to arrival. Per nurses note and on site police, pt was was tazed two times after attempting to run from the police while under arrest. He currently has a probe intact to the R side of the C spine and one intact to the L axilla. Pt unaware of last tetanus shot. Denies any LOC or bowel or urinary changes. He has no other concerns at this time.  Past Medical History  Diagnosis Date  . Alcohol abuse   . Cocaine abuse   . Back pain    History reviewed. No pertinent past surgical history. No family history on file. History  Substance Use Topics  . Smoking status: Current Every Day Smoker  . Smokeless tobacco: Not on file  . Alcohol Use: Yes    Review of Systems  Constitutional: Negative for fever and chills.  HENT: Negative for congestion.   Eyes: Negative for redness.  Respiratory: Negative for cough.   Skin: Positive for wound. Negative for rash.  Psychiatric/Behavioral: Negative for confusion.      Allergies  Review of patient's allergies indicates no known allergies.  Home Medications   Prior to Admission medications   Medication Sig Start Date End Date Taking? Authorizing Provider  cyclobenzaprine (FLEXERIL) 10 MG tablet Take 1 tablet (10 mg total) by mouth 3 (three) times daily as needed for muscle spasms. 01/27/14   Trixie DredgeEmily West, PA-C  HYDROcodone-acetaminophen (NORCO/VICODIN) 5-325 MG per tablet Take 1 tablet by mouth every 4 (four) hours as needed. 01/27/14    Trixie DredgeEmily West, PA-C   Triage Vitals: There were no vitals taken for this visit.    Physical Exam  Nursing note and vitals reviewed. Constitutional: He is oriented to person, place, and time. He appears well-developed and well-nourished.  HENT:  Head: Normocephalic and atraumatic.  Eyes: EOM are normal.  Neck: Normal range of motion.  Cardiovascular: Normal rate.   Pulmonary/Chest: Effort normal.  Musculoskeletal: Normal range of motion.  Neurological: He is alert and oriented to person, place, and time.  Skin: Skin is warm and dry.  Puncture wound to R side of C spine and L axilla   Psychiatric: He has a normal mood and affect. His behavior is normal.    ED Course  Procedures (including critical care time)  COORDINATION OF CARE: 5:50 PM-Discussed treatment plan with pt at bedside and pt agreed to plan.     Labs Review Labs Reviewed - No data to display  Imaging Review No results found.   EKG Interpretation None      MDM   Final diagnoses:  Taser injury  Puncture wound   Taser prongs removed. Bleeding controlled. Wounds cleansed. Patient remanded to custody of [employer]>  I personally performed the services described in this documentation, which was scribed in my presence. The recorded information has been reviewed and is accurate.    Arthor CaptainAbigail Hatsuko Bizzarro, PA-C 03/17/14 0136
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
## 2014-03-12 NOTE — ED Notes (Signed)
Dr. Delo at bedside. 

## 2014-03-12 NOTE — ED Notes (Signed)
The pt just left this ed and was brought back in after he was tazed and he is accompanied by the [employer]

## 2014-03-12 NOTE — ED Notes (Addendum)
Unable to assess events prior to arrival. Pt unable to answer any questions; pt stating "I didn't do anything" and "Where's Amanda?" When asked about taking any drugs today, pt sts "I didn't do anything"

## 2014-03-12 NOTE — ED Notes (Signed)
Unable to obtain automatic bp due to patient movement; attempted manuel bp with two RNs and 2 [employer] officers instructing pt to lay still; unable to obtain due to patient movement and pt flexing muscles.  Pt moving in bed, trying to get out of bed. [employer] at bedside; pt handcuffed to bed by [employer] continuously since arrival

## 2014-03-12 NOTE — Discharge Instructions (Signed)
Return for signs of infection including, redness, swelling, streaking, pus, fever.  Puncture Wound A puncture wound is an injury that extends through all layers of the skin and into the tissue beneath the skin (subcutaneous tissue). Puncture wounds become infected easily because germs often enter the body and go beneath the skin during the injury. Having a deep wound with a small entrance point makes it difficult for your caregiver to adequately clean the wound. This is especially true if you have stepped on a nail and it has passed through a dirty shoe or other situations where the wound is obviously contaminated. CAUSES  Many puncture wounds involve glass, nails, splinters, fish hooks, or other objects that enter the skin (foreign bodies). A puncture wound may also be caused by a human bite or animal bite. DIAGNOSIS  A puncture wound is usually diagnosed by your history and a physical exam. You may need to have an X-ray or an ultrasound to check for any foreign bodies still in the wound. TREATMENT   Your caregiver will clean the wound as thoroughly as possible. Depending on the location of the wound, a bandage (dressing) may be applied.  Your caregiver might prescribe antibiotic medicines.  You may need a follow-up visit to check on your wound. Follow all instructions as directed by your caregiver. HOME CARE INSTRUCTIONS   Change your dressing once per day, or as directed by your caregiver. If the dressing sticks, it may be removed by soaking the area in water.  If your caregiver has given you follow-up instructions, it is very important that you return for a follow-up appointment. Not following up as directed could result in a chronic or permanent injury, pain, and disability.  Only take over-the-counter or prescription medicines for pain, discomfort, or fever as directed by your caregiver.  If you are given antibiotics, take them as directed. Finish them even if you start to feel  better. You may need a tetanus shot if:  You cannot remember when you had your last tetanus shot.  You have never had a tetanus shot. If you got a tetanus shot, your arm may swell, get red, and feel warm to the touch. This is common and not a problem. If you need a tetanus shot and you choose not to have one, there is a rare chance of getting tetanus. Sickness from tetanus can be serious. You may need a rabies shot if an animal bite caused your puncture wound. SEEK MEDICAL CARE IF:   You have redness, swelling, or increasing pain in the wound.  You have red streaks going away from the wound.  You notice a bad smell coming from the wound or dressing.  You have yellowish-white fluid (pus) coming from the wound.  You are treated with an antibiotic for infection, but the infection is not getting better.  You notice something in the wound, such as rubber from your shoe, cloth, or another object.  You have a fever.  You have severe pain.  You have difficulty breathing.  You feel dizzy or faint.  You cannot stop vomiting.  You lose feeling, develop numbness, or cannot move a limb below the wound.  Your symptoms worsen. MAKE SURE YOU:  Understand these instructions.  Will watch your condition.  Will get help right away if you are not doing well or get worse. Document Released: 08/24/2005 Document Revised: 02/06/2012 Document Reviewed: 05/03/2011 [facility] Patient Information 2014 [employer] [place].

## 2014-03-12 NOTE — ED Notes (Signed)
Spoke with [employer] asked if Patient can call [name]. Patient unable to call per Police.
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
## 2014-03-13 ENCOUNTER — Encounter (HOSPITAL_COMMUNITY): Payer: Self-pay | Admitting: Emergency Medicine

## 2014-03-19 NOTE — ED Provider Notes (Signed)
Medical screening examination/treatment/procedure(s) were performed by non-physician practitioner and as supervising physician I was immediately available for consultation/collaboration.   EKG Interpretation None        Jerriah Ines, MD 03/19/14 1540 

## 2015-01-12 ENCOUNTER — Telehealth: Payer: Self-pay | Admitting: Family Medicine

## 2015-01-12 NOTE — Telephone Encounter (Signed)
Opened in error

## 2017-02-01 ENCOUNTER — Ambulatory Visit (HOSPITAL_COMMUNITY)
Admission: EM | Admit: 2017-02-01 | Discharge: 2017-02-01 | Disposition: A | Discharge status: Home / Self Care | Payer: BLUE CROSS/BLUE SHIELD | Attending: Family Medicine | Admitting: Family Medicine

## 2017-02-01 ENCOUNTER — Encounter (HOSPITAL_COMMUNITY): Payer: Self-pay | Admitting: *Deleted

## 2017-02-01 ENCOUNTER — Ambulatory Visit (INDEPENDENT_AMBULATORY_CARE_PROVIDER_SITE_OTHER): Payer: BLUE CROSS/BLUE SHIELD

## 2017-02-01 DIAGNOSIS — J209 Acute bronchitis, unspecified: Secondary | ICD-10-CM

## 2017-02-01 DIAGNOSIS — J40 Bronchitis, not specified as acute or chronic: Secondary | ICD-10-CM | POA: Diagnosis not present

## 2017-02-01 DIAGNOSIS — K529 Noninfective gastroenteritis and colitis, unspecified: Secondary | ICD-10-CM

## 2017-02-01 MED ORDER — ONDANSETRON 4 MG PO TBDP
4.0000 mg | ORAL_TABLET | Freq: Once | ORAL | Status: AC
  Administered 2017-02-01: 4 mg via ORAL

## 2017-02-01 MED ORDER — ALBUTEROL SULFATE HFA 108 (90 BASE) MCG/ACT IN AERS: 2.0000 | INHALATION_SPRAY | RESPIRATORY_TRACT | 1 refills | Status: AC | PRN

## 2017-02-01 MED ORDER — ONDANSETRON 4 MG PO TBDP
ORAL_TABLET | ORAL | Status: AC
  Filled 2017-02-01: qty 1

## 2017-02-01 MED ORDER — ONDANSETRON 8 MG PO TBDP
8.0000 mg | ORAL_TABLET | Freq: Three times a day (TID) | ORAL | 0 refills | Status: AC | PRN
Start: 2017-02-01 — End: ?

## 2017-02-01 NOTE — ED Provider Notes (Signed)
[facility]    CSN: [id number] Arrival date & time: 02/01/17  1209     History   Chief Complaint Chief Complaint  Patient presents with  . Cough    HPI Leonard Bullock is a 51 y.o. male.   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 He's only vomited once. He is the only one sick at home. He works in a dusty environment over at [employer].  While is here, patient states that he's had chronic cough for 3 months. He's had no shortness of breath or chest pain. He does smoke cigarettes. He knows he needs to quit. He's never had asthma.      Past Medical History:  Diagnosis Date  . Alcohol abuse   . Back pain   . Cocaine abuse     There are no active problems to display for this patient.   History reviewed. No pertinent surgical history.     Home Medications    Prior to Admission medications   Medication Sig Start Date End Date Taking? Authorizing Provider  albuterol (PROVENTIL HFA;VENTOLIN HFA) 108 (90 Base) MCG/ACT inhaler Inhale 2 puffs into the lungs every 4 (four) hours as needed for wheezing or shortness of breath (cough, shortness of breath or wheezing.). 02/01/17   Elvina SidleKurt Bobette Leyh, MD  ondansetron (ZOFRAN-ODT) 8 MG disintegrating tablet Take 1 tablet (8 mg total) by mouth every 8 (eight) hours as needed for nausea. 02/01/17   Elvina SidleKurt Avon Molock, MD    Family History History reviewed. No pertinent family history.  Social History Social History  Substance Use Topics  . Smoking status: Current Every Day Smoker  . Smokeless tobacco: Not on file  . Alcohol use Yes     Allergies   Patient has no known allergies.   Review of Systems Review of Systems  Constitutional: Positive for fatigue.  HENT: Negative.   Respiratory: Positive for cough. Negative for shortness of breath.   Cardiovascular: Negative.   Gastrointestinal: Positive for diarrhea, nausea and vomiting.  Musculoskeletal: Negative.    Neurological: Negative.      Physical Exam Triage Vital Signs ED Triage Vitals  Enc Vitals Group     BP 02/01/17 1321 136/81     Pulse Rate 02/01/17 1321 92     Resp 02/01/17 1321 18     Temp 02/01/17 1321 99.1 F (37.3 C)     Temp Source 02/01/17 1321 Oral     SpO2 02/01/17 1321 99 %     Weight --      Height --      Head Circumference --      Peak Flow --      Pain Score 02/01/17 1324 7     Pain Loc --      Pain Edu? --      Excl. in GC? --    No data found.   Updated Vital Signs BP 136/81 (BP Location: Right Arm)   Pulse 92   Temp 99.1 F (37.3 C) (Oral)   Resp 18   SpO2 99%    Physical Exam  Constitutional: He is oriented to person, place, and time. He appears well-developed and well-nourished.  HENT:  Head: Normocephalic.  Right Ear: External ear normal.  Left Ear: External ear normal.  Mouth/Throat: Oropharynx is clear and moist.  Eyes: Conjunctivae and EOM are normal. Pupils are equal, round, and reactive to light.  Neck: Normal range of motion. Neck supple.  Cardiovascular:  Normal rate, regular rhythm and normal heart sounds.   Pulmonary/Chest: Effort normal. He has rales.  Abdominal: Soft.  Hyperactive bowel sounds, no mass, nontender  Musculoskeletal: Normal range of motion.  Neurological: He is alert and oriented to person, place, and time.  Skin: Skin is warm and dry.  Nursing note and vitals reviewed.    UC Treatments / Results  Labs (all labs ordered are listed, but only abnormal results are displayed) Labs Reviewed - No data to display  EKG  EKG Interpretation None       Radiology Dg Chest 2 View  Result Date: 02/01/2017 CLINICAL DATA:  Cough and congestion 3 months EXAM: CHEST  2 VIEW COMPARISON:  01/27/2014 FINDINGS: The heart size and mediastinal contours are within normal limits. Both lungs are clear. The visualized skeletal structures are unremarkable. IMPRESSION: No active cardiopulmonary disease. Electronically Signed   By:  Marlan Palau M.D.   On: 02/01/2017 14:01    Procedures Procedures (including critical care time)  Medications Ordered in UC Medications  ondansetron (ZOFRAN-ODT) disintegrating tablet 4 mg (not administered)     Initial Impression / Assessment and Plan / UC Course  I have reviewed the triage vital signs and the nursing notes.  Pertinent labs & imaging results that were available during my care of the patient were reviewed by me and considered in my medical decision making (see chart for details).     Final Clinical Impressions(s) / UC Diagnoses   Final diagnoses:  Gastroenteritis  Bronchitis    New Prescriptions New Prescriptions   ALBUTEROL (PROVENTIL HFA;VENTOLIN HFA) 108 (90 BASE) MCG/ACT INHALER    Inhale 2 puffs into the lungs every 4 (four) hours as needed for wheezing or shortness of breath (cough, shortness of breath or wheezing.).   ONDANSETRON (ZOFRAN-ODT) 8 MG DISINTEGRATING TABLET    Take 1 tablet (8 mg total) by mouth every 8 (eight) hours as needed for nausea.     Elvina Sidle, MD 02/01/17 1410

## 2017-02-01 NOTE — ED Triage Notes (Signed)
Pt  Reports     Coughing    X   3  Months        Getting  Worse  This  Am      Pt  Reports    Symptoms   Worse  This  Am   Sneezing  And  Runny  Nose  As   Well

## 2017-02-01 NOTE — ED Triage Notes (Signed)
Pt  Reports   Nausea   abd  Pain  And  Diarrhea   As   Well

## 2017-06-27 IMAGING — DX DG CHEST 2V
2 series · 2 of 2 positions shown · non-contrast
Comparison: 01/27/2014

CLINICAL DATA: Cough and congestion 3 months

EXAM:
CHEST  2 VIEW

[chest lat]
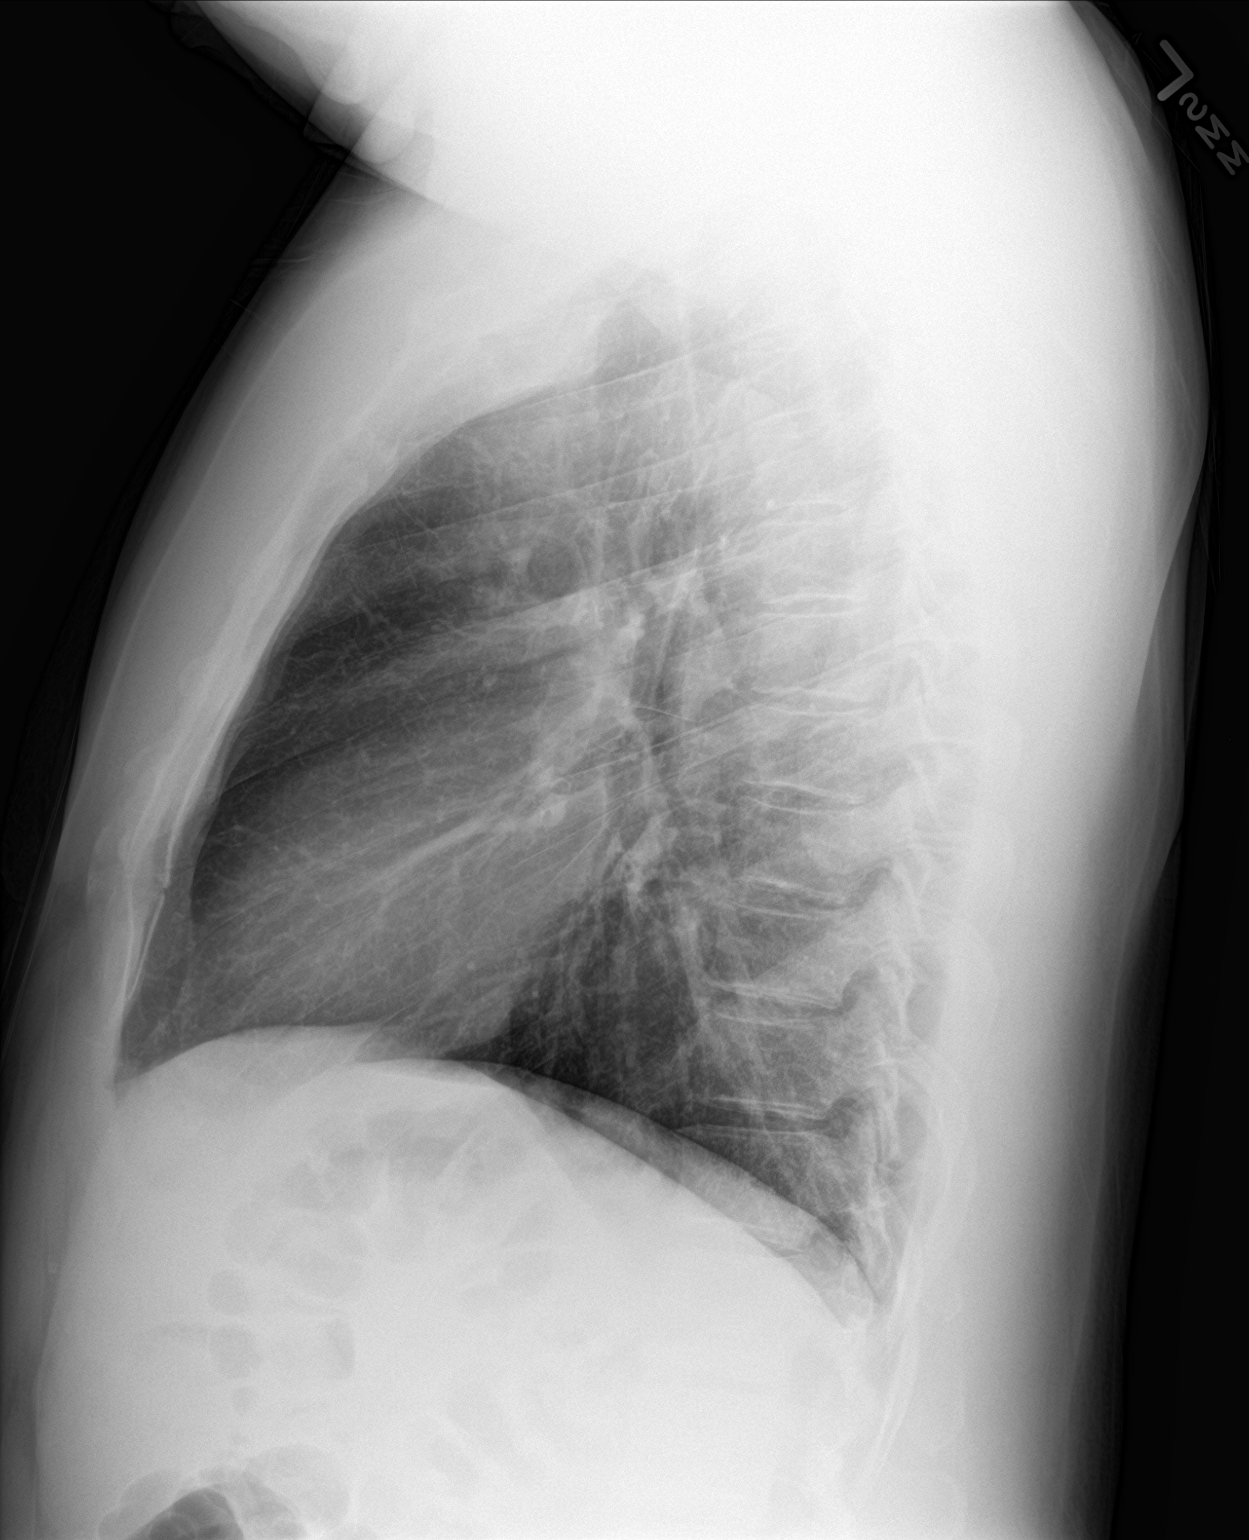

[chest pa]
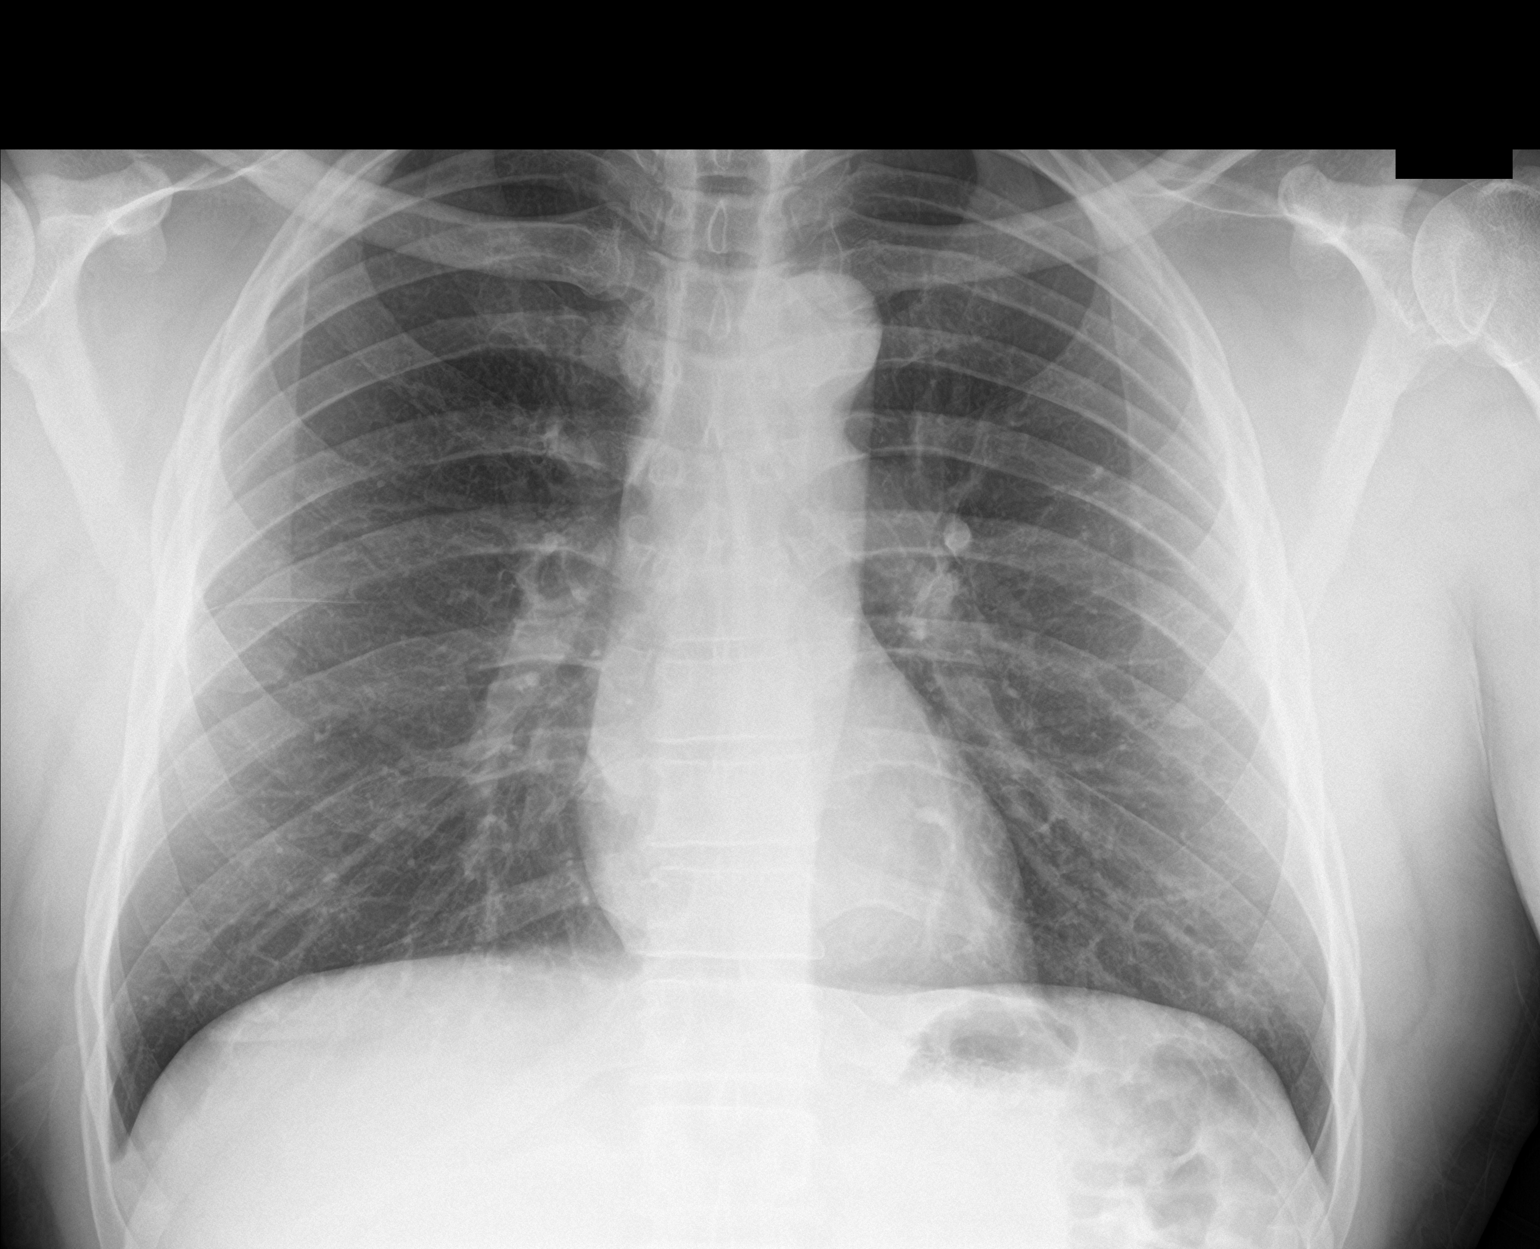

[2 of 2 positions shown; findings below may reference images not displayed]

FINDINGS: The heart size and mediastinal contours are within normal limits.
Both lungs are clear. The visualized skeletal structures are
unremarkable.
IMPRESSION: No active cardiopulmonary disease.
